# Patient Record
Sex: Male | Born: 1960 | Race: White | Hispanic: No | Marital: Married | Smoking: Current every day smoker
Health system: Southern US, Community
[De-identification: ages and names within clinical notes are randomized; demographics above are authoritative.]

## PROBLEM LIST (undated history)

## (undated) HISTORY — PX: FINGER SURGERY: SHX640

---

## 2011-11-21 ENCOUNTER — Emergency Department: Payer: Self-pay | Admitting: Internal Medicine

## 2017-05-29 ENCOUNTER — Other Ambulatory Visit: Payer: Self-pay

## 2017-05-29 ENCOUNTER — Ambulatory Visit
Admission: EM | Admit: 2017-05-29 | Discharge: 2017-05-29 | Disposition: A | Discharge status: Home / Self Care | Payer: BLUE CROSS/BLUE SHIELD | Attending: Family Medicine | Admitting: Family Medicine

## 2017-05-29 DIAGNOSIS — J40 Bronchitis, not specified as acute or chronic: Secondary | ICD-10-CM

## 2017-05-29 DIAGNOSIS — R05 Cough: Secondary | ICD-10-CM | POA: Diagnosis not present

## 2017-05-29 DIAGNOSIS — R059 Cough, unspecified: Secondary | ICD-10-CM

## 2017-05-29 MED ORDER — AZITHROMYCIN 250 MG PO TABS: ORAL_TABLET | ORAL | 0 refills | Status: DC

## 2017-05-29 MED ORDER — ALBUTEROL SULFATE HFA 108 (90 BASE) MCG/ACT IN AERS: 1.0000 | INHALATION_SPRAY | Freq: Four times a day (QID) | RESPIRATORY_TRACT | 0 refills | Status: AC | PRN

## 2017-05-29 MED ORDER — PREDNISONE 20 MG PO TABS: ORAL_TABLET | ORAL | 0 refills | Status: DC

## 2017-05-29 NOTE — ED Provider Notes (Signed)
[facility]    CSN: [id number] Arrival date & time: 05/29/17  1332     History   Chief Complaint Chief Complaint  Patient presents with  . Cough  . Nasal Congestion    HPI David Kaiser is a 56 y.o. male.   The history is provided by the patient.  Cough  Associated symptoms: wheezing   URI  Presenting symptoms: congestion and cough   Severity:  Moderate Onset quality:  Sudden Duration:  7 days Timing:  Constant Progression:  Worsening Chronicity:  New Relieved by:  Nothing Ineffective treatments:  OTC medications Associated symptoms: wheezing   Risk factors: not elderly, no chronic cardiac disease, no chronic kidney disease, no diabetes mellitus, no immunosuppression, no recent illness and no recent travel  Chronic respiratory disease: unknown, however chronic smoker.     No past medical history on file.  There are no active problems to display for this patient.        Home Medications    Prior to Admission medications   Medication Sig Start Date End Date Taking? Authorizing Provider  albuterol (PROVENTIL HFA;VENTOLIN HFA) 108 (90 Base) MCG/ACT inhaler Inhale 1-2 puffs into the lungs every 6 (six) hours as needed for wheezing or shortness of breath. 05/29/17   Payton Mccallumonty, Evy Lutterman, MD  azithromycin (ZITHROMAX Z-PAK) 250 MG tablet 2 tabs po once day 1, then 1 tab po qd for next 4 days 05/29/17   Payton Mccallumonty, Sharada Albornoz, MD  predniSONE (DELTASONE) 20 MG tablet 3 tabs po once day 1, then 2 tabs po qd for 2 days, then 1 tab po qd for 2 days, then half a tab po qd for 2 days, 05/29/17   Payton Mccallumonty, Barclay Lennox, MD    Family History No family history on file.  Social History Social History   Tobacco Use  . Smoking status: Not on file  Substance Use Topics  . Alcohol use: Not on file  . Drug use: Not on file     Allergies   Patient has no known allergies.   Review of Systems Review of Systems  HENT: Positive for congestion.   Respiratory: Positive for  cough and wheezing.      Physical Exam Triage Vital Signs ED Triage Vitals  Enc Vitals Group     BP 05/29/17 1427 135/84     Pulse Rate 05/29/17 1427 78     Resp --      Temp 05/29/17 1427 98.6 F (37 C)     Temp Source 05/29/17 1427 Oral     SpO2 05/29/17 1427 97 %     Weight 05/29/17 1429 204 lb (92.5 kg)     Height 05/29/17 1429 5\' 11"  (1.803 m)     Head Circumference --      Peak Flow --      Pain Score --      Pain Loc --      Pain Edu? --      Excl. in GC? --    No data found.  Updated Vital Signs BP 135/84 (BP Location: Left Arm)   Pulse 78   Temp 98.6 F (37 C) (Oral)   Ht 5\' 11"  (1.803 m)   Wt 204 lb (92.5 kg)   SpO2 97%   BMI 28.45 kg/m   Visual Acuity Right Eye Distance:   Left Eye Distance:   Bilateral Distance:    Right Eye Near:   Left Eye Near:    Bilateral Near:     Physical  Exam  Constitutional: He appears well-developed and well-nourished. No distress.  HENT:  Head: Normocephalic and atraumatic.  Right Ear: Tympanic membrane, external ear and ear canal normal.  Left Ear: Tympanic membrane, external ear and ear canal normal.  Nose: Nose normal.  Mouth/Throat: Uvula is midline, oropharynx is clear and moist and mucous membranes are normal. No oropharyngeal exudate or tonsillar abscesses.  Eyes: Conjunctivae and EOM are normal. Pupils are equal, round, and reactive to light. Right eye exhibits no discharge. Left eye exhibits no discharge. No scleral icterus.  Neck: Normal range of motion. Neck supple. No tracheal deviation present. No thyromegaly present.  Cardiovascular: Normal rate, regular rhythm and normal heart sounds.  Pulmonary/Chest: Effort normal. No stridor. No respiratory distress. He has wheezes. He has rales. He exhibits no tenderness.  Lymphadenopathy:    He has no cervical adenopathy.  Neurological: He is alert.  Skin: Skin is warm and dry. No rash noted. He is not diaphoretic.  Nursing note and vitals reviewed.    UC  Treatments / Results  Labs (all labs ordered are listed, but only abnormal results are displayed) Labs Reviewed - No data to display  EKG  EKG Interpretation None       Radiology No results found.  Procedures Procedures (including critical care time)  Medications Ordered in UC Medications - No data to display   Initial Impression / Assessment and Plan / UC Course  I have reviewed the triage vital signs and the nursing notes.  Pertinent labs & imaging results that were available during my care of the patient were reviewed by me and considered in my medical decision making (see chart for details).      Final Clinical Impressions(s) / UC Diagnoses   Final diagnoses:  Cough  Bronchitis    ED Discharge Orders        Ordered    azithromycin (ZITHROMAX Z-PAK) 250 MG tablet     05/29/17 1448    predniSONE (DELTASONE) 20 MG tablet     05/29/17 1448    albuterol (PROVENTIL HFA;VENTOLIN HFA) 108 (90 Base) MCG/ACT inhaler  Every 6 hours PRN     05/29/17 1448     1. diagnosis reviewed with patient 2. rx as per orders above; reviewed possible side effects, interactions, risks and benefits  3. Recommend supportive treatment with rest, fluids 4. Follow-up prn if symptoms worsen or don't improve   Controlled Substance Prescriptions [place] Controlled Substance Registry consulted? Not Applicable   Payton Mccallum, MD 05/29/17 [phone]

## 2017-05-29 NOTE — ED Triage Notes (Signed)
5 day hx of cough, congestion and diarrhea.  Productive cough was green but now white.  No known fever. Some relief with imodium and mucinex.

## 2019-01-01 ENCOUNTER — Emergency Department
Admission: EM | Admit: 2019-01-01 | Discharge: 2019-01-01 | Disposition: A | Discharge status: Home / Self Care | Payer: Self-pay | Attending: Emergency Medicine | Admitting: Emergency Medicine

## 2019-01-01 ENCOUNTER — Encounter: Payer: Self-pay | Admitting: Emergency Medicine

## 2019-01-01 ENCOUNTER — Emergency Department: Payer: Self-pay

## 2019-01-01 ENCOUNTER — Other Ambulatory Visit: Payer: Self-pay

## 2019-01-01 DIAGNOSIS — R0789 Other chest pain: Secondary | ICD-10-CM | POA: Insufficient documentation

## 2019-01-01 DIAGNOSIS — R609 Edema, unspecified: Secondary | ICD-10-CM | POA: Insufficient documentation

## 2019-01-01 DIAGNOSIS — R0602 Shortness of breath: Secondary | ICD-10-CM | POA: Insufficient documentation

## 2019-01-01 DIAGNOSIS — F17200 Nicotine dependence, unspecified, uncomplicated: Secondary | ICD-10-CM | POA: Insufficient documentation

## 2019-01-01 DIAGNOSIS — J209 Acute bronchitis, unspecified: Secondary | ICD-10-CM | POA: Insufficient documentation

## 2019-01-01 DIAGNOSIS — J40 Bronchitis, not specified as acute or chronic: Secondary | ICD-10-CM

## 2019-01-01 LAB — COMPREHENSIVE METABOLIC PANEL
ALT: 31 U/L (ref 0–44)
AST: 24 U/L (ref 15–41)
Albumin: 4.1 g/dL (ref 3.5–5.0)
Alkaline Phosphatase: 60 U/L (ref 38–126)
Anion gap: 9 (ref 5–15)
BUN: 18 mg/dL (ref 6–20)
CO2: 24 mmol/L (ref 22–32)
Calcium: 8.6 mg/dL — ABNORMAL LOW (ref 8.9–10.3)
Chloride: 104 mmol/L (ref 98–111)
Creatinine, Ser: 0.84 mg/dL (ref 0.61–1.24)
GFR calc Af Amer: >60 mL/min (ref >60)
GFR calc non Af Amer: >60 mL/min (ref >60)
Glucose, Bld: 128 mg/dL — ABNORMAL HIGH (ref 70–99)
Potassium: 4.1 mmol/L (ref 3.5–5.1)
Sodium: 137 mmol/L (ref 135–145)
Total Bilirubin: 0.6 mg/dL (ref 0.3–1.2)
Total Protein: 6.8 g/dL (ref 6.5–8.1)

## 2019-01-01 LAB — CBC WITH DIFFERENTIAL/PLATELET
Abs Immature Granulocytes: 0.04 10*3/uL (ref 0.00–0.07)
Basophils Absolute: 0 10*3/uL (ref 0.0–0.1)
Basophils Relative: 1 %
Eosinophils Absolute: 0.2 10*3/uL (ref 0.0–0.5)
Eosinophils Relative: 3 %
HCT: 44.3 % (ref 39.0–52.0)
Hemoglobin: 15 g/dL (ref 13.0–17.0)
Immature Granulocytes: 1 %
Lymphocytes Relative: 21 %
Lymphs Abs: 1.3 10*3/uL (ref 0.7–4.0)
MCH: 34.6 pg — ABNORMAL HIGH (ref 26.0–34.0)
MCHC: 33.9 g/dL (ref 30.0–36.0)
MCV: 102.3 fL — ABNORMAL HIGH (ref 80.0–100.0)
Monocytes Absolute: 0.7 10*3/uL (ref 0.1–1.0)
Monocytes Relative: 11 %
Neutro Abs: 4.2 10*3/uL (ref 1.7–7.7)
Neutrophils Relative %: 63 %
Platelets: 175 10*3/uL (ref 150–400)
RBC: 4.33 MIL/uL (ref 4.22–5.81)
RDW: 13.2 % (ref 11.5–15.5)
WBC: 6.5 10*3/uL (ref 4.0–10.5)
nRBC: 0 % (ref 0.0–0.2)

## 2019-01-01 LAB — BRAIN NATRIURETIC PEPTIDE: B Natriuretic Peptide: 30 pg/mL (ref 0.0–100.0)

## 2019-01-01 LAB — TROPONIN I (HIGH SENSITIVITY): Troponin I (High Sensitivity): <2 ng/L (ref <18)

## 2019-01-01 LAB — FIBRIN DERIVATIVES D-DIMER (ARMC ONLY): Fibrin derivatives D-dimer (ARMC): 404.31 ng/mL (FEU) (ref 0.00–499.00)

## 2019-01-01 MED ORDER — IPRATROPIUM-ALBUTEROL 0.5-2.5 (3) MG/3ML IN SOLN
3.0000 mL | Freq: Once | RESPIRATORY_TRACT | Status: AC
  Administered 2019-01-01: 3 mL via RESPIRATORY_TRACT
  Filled 2019-01-01: qty 3

## 2019-01-01 MED ORDER — PREDNISONE 20 MG PO TABS: 40.0000 mg | ORAL_TABLET | Freq: Every day | ORAL | 0 refills | Status: AC

## 2019-01-01 MED ORDER — METHYLPREDNISOLONE SODIUM SUCC 125 MG IJ SOLR
125.0000 mg | Freq: Once | INTRAMUSCULAR | Status: AC
  Administered 2019-01-01: 125 mg via INTRAVENOUS
  Filled 2019-01-01: qty 2

## 2019-01-01 MED ORDER — DOXYCYCLINE HYCLATE 50 MG PO CAPS: 100.0000 mg | ORAL_CAPSULE | Freq: Two times a day (BID) | ORAL | 0 refills | Status: AC

## 2019-01-01 MED ORDER — ALBUTEROL SULFATE HFA 108 (90 BASE) MCG/ACT IN AERS
2.0000 | INHALATION_SPRAY | Freq: Once | RESPIRATORY_TRACT | Status: AC
  Administered 2019-01-01: 2 via RESPIRATORY_TRACT
  Filled 2019-01-01: qty 6.7

## 2019-01-01 MED ORDER — FUROSEMIDE 10 MG/ML IJ SOLN
40.0000 mg | Freq: Once | INTRAMUSCULAR | Status: AC
  Administered 2019-01-01: 40 mg via INTRAVENOUS
  Filled 2019-01-01: qty 4

## 2019-01-01 MED ORDER — KETOROLAC TROMETHAMINE 30 MG/ML IJ SOLN
15.0000 mg | Freq: Once | INTRAMUSCULAR | Status: AC
  Administered 2019-01-01: 15 mg via INTRAVENOUS
  Filled 2019-01-01: qty 1

## 2019-01-01 MED ORDER — HYDROCODONE-ACETAMINOPHEN 5-325 MG PO TABS: 1.0000 | ORAL_TABLET | Freq: Four times a day (QID) | ORAL | 0 refills | Status: DC | PRN

## 2019-01-01 NOTE — ED Triage Notes (Signed)
Pt presents to ED via POV with c/o cough productive cough with small amounts of clear sputum, SOB, and lower back pain that is worse with with coughing. Pt able to speak in clear and complete sentences at this time. NAD noted. Pt states BLE edema x 3 days as well.

## 2019-01-01 NOTE — Discharge Instructions (Signed)
Use your inhaler, 2 puffs every 4 hours as needed for wheezing and shortness of breath.  Take the steroids and pain medication as needed.

## 2019-01-01 NOTE — ED Provider Notes (Signed)
[facility] Emergency Department Provider Note  ____________________________________________   First MD Initiated Contact with Patient 01/01/19 [phone]     (approximate)  I have reviewed the triage vital signs and the nursing notes.   HISTORY  Chief Complaint Cough, Shortness of Breath, and Back Pain    HPI David Kaiser is a 58 y.o. male here with multiple complaints.  Patient's primary complaint is right-sided back/flank pain.  Patient states that for the past several days, he has had progressively worsening cough.  He states he has a history of recurrent bronchitis during this time of the year, but states that he usually does not get pain with this.  He states that due to his frequent coughing, he is developed a sharp, stabbing, right-sided back pain that shoots around his right side whenever he coughs or takes a deep breath in.  He denies any fevers.  No sputum production.  He does note that he is also had increasing bilateral lower extremity swelling, worse on the right than left, which is new for him.  Denies recent medication change.  Denies any recent fevers or chills.  No recent immobilization or travel.  No history of DVT or PE.  His pain, as mentioned, is worse with deep inspiration and movement.  No alleviating factors.  Denies any preceding injuries.        History reviewed. No pertinent past medical history.  There are no active problems to display for this patient.   Past Surgical History:  Procedure Laterality Date  . FINGER SURGERY      Prior to Admission medications   Medication Sig Start Date End Date Taking? Authorizing Provider  albuterol (PROVENTIL HFA;VENTOLIN HFA) 108 (90 Base) MCG/ACT inhaler Inhale 1-2 puffs into the lungs every 6 (six) hours as needed for wheezing or shortness of breath. 05/29/17   Payton Mccallumonty, [place], MD  azithromycin (ZITHROMAX Z-PAK) 250 MG tablet 2 tabs po once day 1, then 1 tab po qd for next 4 days 05/29/17    Payton Mccallumonty, [place], MD  doxycycline (VIBRAMYCIN) 50 MG capsule Take 2 capsules (100 mg total) by mouth 2 (two) times daily for 7 days. 01/01/19 01/08/19  Shaune PollackIsaacs, Kortni Hasten, MD  HYDROcodone-acetaminophen (NORCO/VICODIN) 5-325 MG tablet Take 1 tablet by mouth every 6 (six) hours as needed for moderate pain (or to help with cough). 01/01/19 01/01/20  Shaune PollackIsaacs, Tripton Ned, MD  predniSONE (DELTASONE) 20 MG tablet Take 2 tablets (40 mg total) by mouth daily for 5 days. 01/01/19 01/06/19  Shaune PollackIsaacs, Allice Garro, MD    Allergies Patient has no known allergies.  No family history on file.  Social History Social History   Tobacco Use  . Smoking status: Current Every Day Smoker  . Smokeless tobacco: Never Used  Substance Use Topics  . Alcohol use: Yes  . Drug use: Never    Review of Systems  Review of Systems  Constitutional: Negative for chills and fever.  HENT: Negative for sore throat.   Respiratory: Positive for cough, chest tightness and shortness of breath.   Cardiovascular: Positive for leg swelling. Negative for chest pain.  Gastrointestinal: Negative for abdominal pain.  Genitourinary: Negative for flank pain.  Musculoskeletal: Negative for neck pain.  Skin: Positive for rash (Small, erythematous, rash to the left thigh). Negative for wound.  Allergic/Immunologic: Negative for immunocompromised state.  Neurological: Negative for weakness and numbness.  Hematological: Does not bruise/bleed easily.     ____________________________________________  PHYSICAL EXAM:      VITAL SIGNS: ED Triage  Vitals [01/01/19 0807]  Enc Vitals Group     BP (!) 133/99     Pulse Rate 79     Resp 18     Temp 97.9 F (36.6 C)     Temp Source Oral     SpO2 98 %     Weight 230 lb (104.3 kg)     Height 5\' 11"  (1.803 m)     Head Circumference      Peak Flow      Pain Score 10     Pain Loc      Pain Edu?      Excl. in GC?      Physical Exam Vitals signs and nursing note reviewed.  Constitutional:      General: He  is not in acute distress.    Appearance: He is well-developed.  HENT:     Head: Normocephalic and atraumatic.  Eyes:     Conjunctiva/sclera: Conjunctivae normal.  Neck:     Musculoskeletal: Neck supple.  Cardiovascular:     Rate and Rhythm: Normal rate and regular rhythm.     Heart sounds: Normal heart sounds. No murmur. No friction rub.  Pulmonary:     Effort: Pulmonary effort is normal. No respiratory distress.     Breath sounds: Wheezing (Scant, expiratory) present. No rales.     Comments: Normal work of breathing, speaking in full sentences Abdominal:     General: There is no distension.     Palpations: Abdomen is soft.     Tenderness: There is no abdominal tenderness.  Musculoskeletal:     Right lower leg: Edema (2+, pitting) present.     Left lower leg: Edema (2+, pitting) present.  Skin:    General: Skin is warm.     Capillary Refill: Capillary refill takes less than 2 seconds.     Comments: Well demarcated, mildly erythematous, approximately 3 x 2 cm rash surrounding hair follicle of the left anterolateral thigh.  No fluctuance.  No drainage.  No open wounds.  Neurological:     Mental Status: He is alert and oriented to person, place, and time.     Motor: No abnormal muscle tone.       ____________________________________________   LABS (all labs ordered are listed, but only abnormal results are displayed)  Labs Reviewed  CBC WITH DIFFERENTIAL/PLATELET - Abnormal; Notable for the following components:      Result Value   MCV 102.3 (*)    MCH 34.6 (*)    All other components within normal limits  COMPREHENSIVE METABOLIC PANEL - Abnormal; Notable for the following components:   Glucose, Bld 128 (*)    Calcium 8.6 (*)    All other components within normal limits  BRAIN NATRIURETIC PEPTIDE  TROPONIN I (HIGH SENSITIVITY)  FIBRIN DERIVATIVES D-DIMER ([facility] ONLY)    ____________________________________________  EKG: Normal sinus rhythm, ventricular rate 70.   Slight early R wave progression, but otherwise no acute ischemic changes.  PR 150, QRS 87, QTc 429. ________________________________________  RADIOLOGY All imaging, including plain films, CT scans, and ultrasounds, independently reviewed by me, and interpretations confirmed via formal radiology reads.  ED MD interpretation:   Chest x-ray: Negative  Official radiology report(s): Dg Chest 2 View  Result Date: 01/01/2019 CLINICAL DATA:  Cough and chest pain EXAM: CHEST - 2 VIEW COMPARISON:  None. FINDINGS: Lungs are clear. Heart size and pulmonary vascularity are normal. No adenopathy. No pneumothorax. There is mild degenerative change in the thoracic spine. IMPRESSION: No  edema or consolidation.  No adenopathy. Electronically Signed   By: Bretta BangWilliam  Woodruff III M.D.   On: 01/01/2019 08:47    ____________________________________________  PROCEDURES   Procedure(s) performed (including Critical Care):  Procedures  ____________________________________________  INITIAL IMPRESSION / MDM / ASSESSMENT AND PLAN / ED COURSE  As part of my medical decision making, I reviewed the following data within the electronic MEDICAL RECORD NUMBER Notes from prior ED visits and [place] Controlled Substance Database      *David Quillian QuinceJ Corkern was evaluated in Emergency Department on 01/01/2019 for the symptoms described in the history of present illness. He was evaluated in the context of the global COVID-19 pandemic, which necessitated consideration that the patient might be at risk for infection with the SARS-CoV-2 virus that causes COVID-19. Institutional protocols and algorithms that pertain to the evaluation of patients at risk for COVID-19 are in a state of rapid change based on information released by regulatory bodies including the CDC and federal and state organizations. These policies and algorithms were followed during the patient's care in the ED.  Some ED evaluations and interventions may be delayed as a result  of limited staffing during the pandemic.*   Clinical Course as of Jan 01 1103  Wed Jan 01, 2019  [id number] 58 year old male here with cough, shortness of breath, and right lateral chest wall pain.  Suspect recurrent bronchitis with likely musculoskeletal chest wall pain, but differential is broad and includes pneumonia, PE, both clinically and supraglottal ACS.  Will check chest x-ray, labs.  No fever or infectious symptoms, doubt coronavirus.   [CI]    Clinical Course User Index [CI] Shaune PollackIsaacs, Chales Pelissier, MD    Medical Decision Making: Lab work reviewed and is overall very reassuring.  Troponin undetectable despite constant symptoms with high sensitivity assay, making ACS unlikely.  Renal function, LFTs, BNP all within normal limits.  D-dimer negative, making PE or other clot, dissection, unlikely.  I suspect he has recurrent bronchitis with musculoskeletal chest wall pain.  Will give him empiric doxycycline as he also has that small area of redness on his left thigh, start on steroids and inhaler, and discharge home.  For his edema, suspect this is dependent in setting of decreased movement from his bronchitis.  He was given a dose of Lasix here.  Will hold on starting this empirically, to prevent dehydration.  He will follow-up with a PCP. ____________________________________________  FINAL CLINICAL IMPRESSION(S) / ED DIAGNOSES  Final diagnoses:  Dependent edema  Bronchitis  Chest wall pain     MEDICATIONS GIVEN DURING THIS VISIT:  Medications  albuterol (VENTOLIN HFA) 108 (90 Base) MCG/ACT inhaler 2 puff (has no administration in time range)  ipratropium-albuterol (DUONEB) 0.5-2.5 (3) MG/3ML nebulizer solution 3 mL (3 mLs Nebulization Given 01/01/19 0947)  ketorolac (TORADOL) 30 MG/ML injection 15 mg (15 mg Intravenous Given 01/01/19 1029)  furosemide (LASIX) injection 40 mg (40 mg Intravenous Given 01/01/19 1030)  methylPREDNISolone sodium succinate (SOLU-MEDROL) 125 mg/2 mL injection 125 mg  (125 mg Intravenous Given 01/01/19 1032)     ED Discharge Orders         Ordered    predniSONE (DELTASONE) 20 MG tablet  Daily     01/01/19 1102    doxycycline (VIBRAMYCIN) 50 MG capsule  2 times daily     01/01/19 1102    HYDROcodone-acetaminophen (NORCO/VICODIN) 5-325 MG tablet  Every 6 hours PRN     01/01/19 1104           Note:  This document was prepared using Dragon voice recognition software and may include unintentional dictation errors.   Duffy Bruce, MD 01/01/19 1104

## 2019-01-09 ENCOUNTER — Other Ambulatory Visit: Payer: Self-pay

## 2019-01-09 ENCOUNTER — Emergency Department: Payer: Self-pay

## 2019-01-09 ENCOUNTER — Emergency Department
Admission: EM | Admit: 2019-01-09 | Discharge: 2019-01-09 | Disposition: A | Discharge status: Home / Self Care | Payer: Self-pay | Attending: Emergency Medicine | Admitting: Emergency Medicine

## 2019-01-09 DIAGNOSIS — W172XXD Fall into hole, subsequent encounter: Secondary | ICD-10-CM | POA: Insufficient documentation

## 2019-01-09 DIAGNOSIS — S301XXD Contusion of abdominal wall, subsequent encounter: Secondary | ICD-10-CM | POA: Insufficient documentation

## 2019-01-09 DIAGNOSIS — F17210 Nicotine dependence, cigarettes, uncomplicated: Secondary | ICD-10-CM | POA: Insufficient documentation

## 2019-01-09 LAB — CBC WITH DIFFERENTIAL/PLATELET
Abs Immature Granulocytes: 0.07 10*3/uL (ref 0.00–0.07)
Basophils Absolute: 0 10*3/uL (ref 0.0–0.1)
Basophils Relative: 0 %
Eosinophils Absolute: 0.2 10*3/uL (ref 0.0–0.5)
Eosinophils Relative: 2 %
HCT: 44.4 % (ref 39.0–52.0)
Hemoglobin: 15.2 g/dL (ref 13.0–17.0)
Immature Granulocytes: 1 %
Lymphocytes Relative: 15 %
Lymphs Abs: 1.4 10*3/uL (ref 0.7–4.0)
MCH: 34.9 pg — ABNORMAL HIGH (ref 26.0–34.0)
MCHC: 34.2 g/dL (ref 30.0–36.0)
MCV: 102.1 fL — ABNORMAL HIGH (ref 80.0–100.0)
Monocytes Absolute: 0.5 10*3/uL (ref 0.1–1.0)
Monocytes Relative: 6 %
Neutro Abs: 7.4 10*3/uL (ref 1.7–7.7)
Neutrophils Relative %: 76 %
Platelets: 182 10*3/uL (ref 150–400)
RBC: 4.35 MIL/uL (ref 4.22–5.81)
RDW: 13 % (ref 11.5–15.5)
WBC: 9.6 10*3/uL (ref 4.0–10.5)
nRBC: 0 % (ref 0.0–0.2)

## 2019-01-09 LAB — COMPREHENSIVE METABOLIC PANEL
ALT: 42 U/L (ref 0–44)
AST: 21 U/L (ref 15–41)
Albumin: 3.9 g/dL (ref 3.5–5.0)
Alkaline Phosphatase: 65 U/L (ref 38–126)
Anion gap: 8 (ref 5–15)
BUN: 20 mg/dL (ref 6–20)
CO2: 24 mmol/L (ref 22–32)
Calcium: 8.2 mg/dL — ABNORMAL LOW (ref 8.9–10.3)
Chloride: 105 mmol/L (ref 98–111)
Creatinine, Ser: 0.91 mg/dL (ref 0.61–1.24)
GFR calc Af Amer: >60 mL/min (ref >60)
GFR calc non Af Amer: >60 mL/min (ref >60)
Glucose, Bld: 183 mg/dL — ABNORMAL HIGH (ref 70–99)
Potassium: 3.7 mmol/L (ref 3.5–5.1)
Sodium: 137 mmol/L (ref 135–145)
Total Bilirubin: 0.5 mg/dL (ref 0.3–1.2)
Total Protein: 6.2 g/dL — ABNORMAL LOW (ref 6.5–8.1)

## 2019-01-09 MED ORDER — TRAMADOL HCL 50 MG PO TABS: 50.0000 mg | ORAL_TABLET | Freq: Four times a day (QID) | ORAL | 0 refills | Status: DC | PRN

## 2019-01-09 MED ORDER — IOHEXOL 300 MG/ML  SOLN
100.0000 mL | Freq: Once | INTRAMUSCULAR | Status: AC | PRN
  Administered 2019-01-09: 100 mL via INTRAVENOUS

## 2019-01-09 MED ORDER — TRAMADOL HCL 50 MG PO TABS
50.0000 mg | ORAL_TABLET | Freq: Once | ORAL | Status: AC
Start: 2019-01-09 — End: 2019-01-09
  Administered 2019-01-09: 16:00:00 50 mg via ORAL
  Filled 2019-01-09: qty 1

## 2019-01-09 NOTE — ED Triage Notes (Signed)
Pt fell in a hole 1-2 weeks ago - started having swelling in bilat hips and legs - he was seen in ED and given Lasix (which helped) and albuterol (which he is out of) Pt now c/o swelling to bilat hips and rib cage area - noted to have bruising to right rib cage - pt c/o [facility]

## 2019-01-09 NOTE — ED Triage Notes (Signed)
Says he is here for same thing as last time.  Swelling of lower extremities up to chest area.  Says it is worse on the right now.  Has not been able to get pcp yet.

## 2019-01-09 NOTE — Discharge Instructions (Addendum)
Return to the ER for new, worsening, or persistent pain, swelling, difficulty breathing, weakness or lightheadedness, or any other new or worsening symptoms that concern you.

## 2019-01-09 NOTE — ED Provider Notes (Signed)
[facility] Emergency Department Provider Note ____________________________________________   First MD Initiated Contact with Patient 01/09/19 1551     (approximate)  I have reviewed the triage vital signs and the nursing notes.   HISTORY  Chief Complaint Shortness of Breath and Leg Swelling    HPI David Kaiser is a 58 y.o. male with PMH as noted below who presents primarily for persistent symptoms after a fall about 10 days ago, mainly right sided abdominal pain, bruising, and swelling.  The patient states that he fell into a hole in his garden.  He states he initially had swelling and pain to the right-sided ribs, right abdomen, as well as bilateral hips and legs with some worse swelling on the right.  He was evaluated in the ED at that time.  The patient states that the main symptom that is persistent and somewhat worse is bruising and pain along the right side of his abdomen.  He states that the swelling to the legs and hips has markedly improved on both sides.  He reports pain with taking a deep breath but denies any significant shortness of breath to me.   History reviewed. No pertinent past medical history.  There are no active problems to display for this patient.   Past Surgical History:  Procedure Laterality Date  . FINGER SURGERY      Prior to Admission medications   Medication Sig Start Date End Date Taking? Authorizing Provider  albuterol (PROVENTIL HFA;VENTOLIN HFA) 108 (90 Base) MCG/ACT inhaler Inhale 1-2 puffs into the lungs every 6 (six) hours as needed for wheezing or shortness of breath. 05/29/17   Norval Gable, MD  azithromycin (ZITHROMAX Z-PAK) 250 MG tablet 2 tabs po once day 1, then 1 tab po qd for next 4 days 05/29/17   Norval Gable, MD  HYDROcodone-acetaminophen (NORCO/VICODIN) 5-325 MG tablet Take 1 tablet by mouth every 6 (six) hours as needed for moderate pain (or to help with cough). 01/01/19 01/01/20  Duffy Bruce, MD   traMADol (ULTRAM) 50 MG tablet Take 1 tablet (50 mg total) by mouth every 6 (six) hours as needed. 01/09/19 01/09/20  Arta Silence, MD    Allergies Bee venom  No family history on file.  Social History Social History   Tobacco Use  . Smoking status: Current Every Day Smoker    Packs/day: 0.50    Types: Cigarettes  . Smokeless tobacco: Never Used  Substance Use Topics  . Alcohol use: Yes    Alcohol/week: 3.0 - 12.0 standard drinks    Types: 3 - 12 Cans of beer per week    Comment: daily  . Drug use: Never    Review of Systems  Constitutional: No fever. Eyes: No redness. ENT: No sore throat. Cardiovascular: Denies chest pain. Respiratory: Denies shortness of breath. Gastrointestinal: No vomiting or diarrhea.  Genitourinary: Negative for dysuria.  Musculoskeletal: Negative for back pain. Skin: Negative for rash. Neurological: Negative for headache.   ____________________________________________   PHYSICAL EXAM:  VITAL SIGNS: ED Triage Vitals  Enc Vitals Group     BP 01/09/19 1256 132/87     Pulse Rate 01/09/19 1256 96     Resp 01/09/19 1256 18     Temp 01/09/19 1256 98.8 F (37.1 C)     Temp Source 01/09/19 1256 Oral     SpO2 01/09/19 1256 97 %     Weight 01/09/19 1310 233 lb (105.7 kg)     Height 01/09/19 1310 5\' 11"  (1.803 m)  Head Circumference --      Peak Flow --      Pain Score 01/09/19 1310 8     Pain Loc --      Pain Edu? --      Excl. in [place]? --     Constitutional: Alert and oriented. Well appearing and in no acute distress. Eyes: Conjunctivae are normal.  Head: Atraumatic. Nose: No congestion/rhinnorhea. Mouth/Throat: Mucous membranes are moist.   Neck: Normal range of motion.  Cardiovascular: Normal rate, regular rhythm. Grossly normal heart sounds.  Good peripheral circulation. Respiratory: Normal respiratory effort.  No retractions. Lungs CTAB. Gastrointestinal: Soft with ecchymosis along the right side of the abdomen and down to  the suprapubic area with some induration and tenderness.  No distention.  Genitourinary: No flank tenderness. Musculoskeletal: Trace bilateral lower extremity edema.  No calf or popliteal swelling or tenderness.  Extremities warm and well perfused.  Neurologic:  Normal speech and language. No gross focal neurologic deficits are appreciated.  Skin:  Skin is warm and dry. No rash noted. Psychiatric: Mood and affect are normal. Speech and behavior are normal.  ____________________________________________   LABS (all labs ordered are listed, but only abnormal results are displayed)  Labs Reviewed  CBC WITH DIFFERENTIAL/PLATELET - Abnormal; Notable for the following components:      Result Value   MCV 102.1 (*)    MCH 34.9 (*)    All other components within normal limits  COMPREHENSIVE METABOLIC PANEL - Abnormal; Notable for the following components:   Glucose, Bld 183 (*)    Calcium 8.2 (*)    Total Protein 6.2 (*)    All other components within normal limits   ____________________________________________  EKG   ____________________________________________  RADIOLOGY  CXR: No acute abnormality CT abdomen: Subcutaneous edema on the right  ____________________________________________   PROCEDURES  Procedure(s) performed: No  Procedures  Critical Care performed: No ____________________________________________   INITIAL IMPRESSION / ASSESSMENT AND PLAN / ED COURSE  Pertinent labs & imaging results that were available during my care of the patient were reviewed by me and considered in my medical decision making (see chart for details).  58 year old male with PMH as noted above presents with persistent right-sided abdominal pain and bruising after a fall into a hole in his garden about 10 days ago.  I reviewed the past medical records in epic.  The patient was seen on 01/01/2019 with a complaint of right-sided back and flank pain, shortness of breath and cough, and leg  swelling.  He had a negative chest x-ray and negative d-dimer at that time.  I do not see any mention of a trauma at that time, but he states that the fall did happen before he came to the hospital.  On exam today the patient is well-appearing and his vital signs are normal.  He does have relatively significant ecchymosis and some tenderness along the right abdominal wall.  Cardiopulmonary exam is normal.  With the patient told me is a bit different from what was noted in the triage notes.  The patient states that the swelling he experienced in bilateral lower extremities and hips has almost completely resolved and he denies any new or worsening shortness of breath.  At this time there is no clinical evidence of DVT, especially given the negative d-dimer last time and the resolving swelling.  Chest x-ray and lab work-up showed no acute abnormalities.  I mainly concerned for right abdominal wall hematoma but given the persistent pain and swelling  after 10 days, I will obtain CT to further evaluate and rule out any internal injury.  ----------------------------------------- 6:01 PM on 01/09/2019 -----------------------------------------  CT shows subcutaneous edema on the right consistent with the area of hematoma on exam.  Patient had good relief with tramadol.  At this time, he is stable for discharge home.  I counseled him on the results of the work-up and on return precautions.  He expressed understanding. ____________________________________________   FINAL CLINICAL IMPRESSION(S) / ED DIAGNOSES  Final diagnoses:  Abdominal wall hematoma, subsequent encounter      NEW MEDICATIONS STARTED DURING THIS VISIT:  Discharge Medication List as of 01/09/2019  5:36 PM    START taking these medications   Details  traMADol (ULTRAM) 50 MG tablet Take 1 tablet (50 mg total) by mouth every 6 (six) hours as needed., Starting Thu 01/09/2019, Until Fri 01/09/2020, Normal         Note:  This document  was prepared using Dragon voice recognition software and may include unintentional dictation errors.    Dionne Bucy, MD 01/09/19 [phone]

## 2019-07-19 ENCOUNTER — Emergency Department: Payer: Self-pay

## 2019-07-19 ENCOUNTER — Encounter: Payer: Self-pay | Admitting: Emergency Medicine

## 2019-07-19 ENCOUNTER — Other Ambulatory Visit: Payer: Self-pay

## 2019-07-19 ENCOUNTER — Emergency Department
Admission: EM | Admit: 2019-07-19 | Discharge: 2019-07-19 | Disposition: A | Discharge status: Home / Self Care | Payer: Self-pay | Attending: Emergency Medicine | Admitting: Emergency Medicine

## 2019-07-19 DIAGNOSIS — R079 Chest pain, unspecified: Secondary | ICD-10-CM

## 2019-07-19 DIAGNOSIS — R0789 Other chest pain: Secondary | ICD-10-CM | POA: Insufficient documentation

## 2019-07-19 DIAGNOSIS — F17210 Nicotine dependence, cigarettes, uncomplicated: Secondary | ICD-10-CM | POA: Insufficient documentation

## 2019-07-19 LAB — CBC
HCT: 47.3 % (ref 39.0–52.0)
Hemoglobin: 16.1 g/dL (ref 13.0–17.0)
MCH: 33.9 pg (ref 26.0–34.0)
MCHC: 34 g/dL (ref 30.0–36.0)
MCV: 99.6 fL (ref 80.0–100.0)
Platelets: 176 10*3/uL (ref 150–400)
RBC: 4.75 MIL/uL (ref 4.22–5.81)
RDW: 13 % (ref 11.5–15.5)
WBC: 7.4 10*3/uL (ref 4.0–10.5)
nRBC: 0 % (ref 0.0–0.2)

## 2019-07-19 LAB — BASIC METABOLIC PANEL
Anion gap: 14 (ref 5–15)
BUN: 19 mg/dL (ref 6–20)
CO2: 23 mmol/L (ref 22–32)
Calcium: 9.4 mg/dL (ref 8.9–10.3)
Chloride: 98 mmol/L (ref 98–111)
Creatinine, Ser: 0.92 mg/dL (ref 0.61–1.24)
GFR calc Af Amer: >60 mL/min (ref >60)
GFR calc non Af Amer: >60 mL/min (ref >60)
Glucose, Bld: 133 mg/dL — ABNORMAL HIGH (ref 70–99)
Potassium: 4.2 mmol/L (ref 3.5–5.1)
Sodium: 135 mmol/L (ref 135–145)

## 2019-07-19 LAB — TROPONIN I (HIGH SENSITIVITY)
Troponin I (High Sensitivity): 4 ng/L (ref <18)
Troponin I (High Sensitivity): 3 ng/L (ref <18)

## 2019-07-19 MED ORDER — SODIUM CHLORIDE 0.9% FLUSH: 3.0000 mL | Freq: Once | INTRAVENOUS | Status: DC

## 2019-07-19 MED ORDER — PREDNISONE 20 MG PO TABS: 20.0000 mg | ORAL_TABLET | Freq: Every day | ORAL | 0 refills | Status: AC

## 2019-07-19 MED ORDER — LIDOCAINE 5 % EX PTCH: 1.0000 | MEDICATED_PATCH | CUTANEOUS | 0 refills | Status: AC

## 2019-07-19 MED ORDER — OXYCODONE-ACETAMINOPHEN 5-325 MG PO TABS
1.0000 | ORAL_TABLET | Freq: Once | ORAL | Status: AC
  Administered 2019-07-19: 1 via ORAL
  Filled 2019-07-19: qty 1

## 2019-07-19 MED ORDER — CYCLOBENZAPRINE HCL 5 MG PO TABS: 5.0000 mg | ORAL_TABLET | Freq: Three times a day (TID) | ORAL | 0 refills | Status: AC | PRN

## 2019-07-19 NOTE — ED Notes (Signed)
Pt c/o CP since 1am this morning. Pt denies any n/v, blurry vision, or headache. Pt denies and cardiac issues.

## 2019-07-19 NOTE — ED Triage Notes (Signed)
Pt to ED via POV c/o chest pain that started around 0100. Pt states that the pain is located in the right rib area. Pt states that the pain radiates towards the center of his chest. Pt states that the pain has increased in intensity. Pt denies ShOB, N/V, or dizziness. Pt is in NAD.

## 2019-07-19 NOTE — ED Provider Notes (Signed)
[facility] Emergency Department Provider Note ____________________________________________  Time seen: 1117  I have reviewed the triage vital signs and the nursing notes.  HISTORY  Chief Complaint  Chest Pain  HPI David Kaiser is a 59 y.o. male presents himself to the ED for evaluation of sudden onset of chest pain at about 1:00 this morning, while asleep.  Patient describes the pain located primarily in the right rib section, and refers/radiates towards the center of his chest and posteriorly.  The pain has increased in intensity since onset.  The patient notes the chest wall is tender to palpation over the area of concern. Patient denies any shortness of breath, nausea, vomiting, diaphoresis, dizziness, or syncope.  He also denies any weakness, cough, or congestion.   Patient is a current everyday smoker, but denies any preceding history of chronic or intermittent chest pain.  History reviewed. No pertinent past medical history.  There are no problems to display for this patient.   Past Surgical History:  Procedure Laterality Date  . FINGER SURGERY      Prior to Admission medications   Medication Sig Start Date End Date Taking? Authorizing Provider  albuterol (PROVENTIL HFA;VENTOLIN HFA) 108 (90 Base) MCG/ACT inhaler Inhale 1-2 puffs into the lungs every 6 (six) hours as needed for wheezing or shortness of breath. 05/29/17   Payton Mccallum, MD  cyclobenzaprine (FLEXERIL) 5 MG tablet Take 1 tablet (5 mg total) by mouth 3 (three) times daily as needed. 07/19/19   Kamry Faraci, Charlesetta Ivory, PA-C  lidocaine (LIDODERM) 5 % Place 1 patch onto the skin daily for 5 days. Remove & Discard patch after 12 hours of wear each day. 07/19/19 07/24/19  Avonna Iribe, Charlesetta Ivory, PA-C  predniSONE (DELTASONE) 20 MG tablet Take 1 tablet (20 mg total) by mouth daily with breakfast for 5 days. 07/19/19 07/24/19  Ryin Ambrosius, Charlesetta Ivory, PA-C    Allergies Bee venom  History  reviewed. No pertinent family history.  Social History Social History   Tobacco Use  . Smoking status: Current Every Day Smoker    Packs/day: 0.50    Types: Cigarettes  . Smokeless tobacco: Never Used  Substance Use Topics  . Alcohol use: Yes    Alcohol/week: 3.0 - 12.0 standard drinks    Types: 3 - 12 Cans of beer per week    Comment: daily  . Drug use: Never    Review of Systems  Constitutional: Negative for fever. Eyes: Negative for visual changes. ENT: Negative for sore throat. Cardiovascular: Positive for chest pain. Respiratory: Negative for shortness of breath. Gastrointestinal: Negative for abdominal pain, vomiting and diarrhea. Genitourinary: Negative for dysuria. Musculoskeletal: Negative for back pain. Skin: Negative for rash. Neurological: Negative for headaches, focal weakness or numbness. ____________________________________________  PHYSICAL EXAM:  VITAL SIGNS: ED Triage Vitals  Enc Vitals Group     BP 07/19/19 0715 (!) 141/79     Pulse Rate 07/19/19 0715 77     Resp 07/19/19 0715 20     Temp 07/19/19 0719 98.3 F (36.8 C)     Temp Source 07/19/19 0719 Oral     SpO2 07/19/19 0715 97 %     Weight --      Height --      Head Circumference --      Peak Flow --      Pain Score 07/19/19 0719 3     Pain Loc --      Pain Edu? --  Excl. in [place]? --     Constitutional: Alert and oriented. Well appearing and in no distress. Head: Normocephalic and atraumatic. Eyes: Conjunctivae are normal. PERRL. Normal extraocular movements Neck: Supple. No thyromegaly. Cardiovascular: Normal rate, regular rhythm. Normal distal pulses. No murmurs, rubs, or gallops. Normal S1 & S2.  Respiratory: Normal respiratory effort. No wheezes/rales/rhonchi. Gastrointestinal: Soft and nontender. No distention. Musculoskeletal: Normal spinal alignment without midline tenderness, spasm, deformity, or step-off.  Patient with reproducible anterior lateral chest wall pain with  palpation just under the right pectoralis muscle.  Pain is also elicited with extension of the right upper extremity.  Nontender with normal range of motion in all extremities.  Neurologic:  Normal gait without ataxia. Normal speech and language. No gross focal neurologic deficits are appreciated. Skin:  Skin is warm, dry and intact. No rash noted.  No vesicular rash, erythema, or abrasions noted to the anterior chest wall Psychiatric: Mood and affect are normal. Patient exhibits appropriate insight and judgment. ____________________________________________   LABS (pertinent positives/negatives)  Labs Reviewed  BASIC METABOLIC PANEL - Abnormal; Notable for the following components:      Result Value   Glucose, Bld 133 (*)    All other components within normal limits  CBC  TROPONIN I (HIGH SENSITIVITY)  TROPONIN I (HIGH SENSITIVITY)  ____________________________________________  EKG See EKG report ____________________________________________   RADIOLOGY  CXR  Negative ____________________________________________  PROCEDURES  Oxycodone 5-325 mg PO Procedures ____________________________________________  INITIAL IMPRESSION / ASSESSMENT AND PLAN / ED COURSE  Differential diagnosis includes, but is not limited to, ACS, aortic dissection, pulmonary embolism, cardiac tamponade, pneumothorax, pneumonia, pericarditis, myocarditis, GI-related causes including esophagitis/gastritis, and musculoskeletal chest wall pain.    Patient presents to the ED for evaluation of reproducible anterolateral chest wall pain at the right pectoralis, that refers to the center of the chest.  Patient's exam is overall benign reassuring at this time.  Labs and EKG also without any signs of of acute ischemic injury or coronary syndrome.  Patient's heart score is 2 based on his presentation. He is reassured by his negative work-up. He will be discharged with prescriptions for cyclobenzaprine, prednisone, and  Lidoderm patches. He will follow-up with his PCP for ongoing symptoms, or return as needed.   DAESHAUN SPECHT was evaluated in Emergency Department on 07/19/2019 for the symptoms described in the history of present illness. He was evaluated in the context of the global COVID-19 pandemic, which necessitated consideration that the patient might be at risk for infection with the SARS-CoV-2 virus that causes COVID-19. Institutional protocols and algorithms that pertain to the evaluation of patients at risk for COVID-19 are in a state of rapid change based on information released by regulatory bodies including the CDC and federal and state organizations. These policies and algorithms were followed during the patient's care in the ED. ____________________________________________  FINAL CLINICAL IMPRESSION(S) / ED DIAGNOSES  Final diagnoses:  Nonspecific chest pain  Chest wall pain      Monish Haliburton, Dannielle Karvonen, PA-C 07/19/19 1823    Merlyn Lot, MD 07/20/19 [phone]

## 2019-07-19 NOTE — Discharge Instructions (Addendum)
Your exam, labs, and, CXR are all normal at this time. Your symptoms do not seem to indicate a heart (cardiac) cause, which is reassuring. Take the prescription meds as directed. Follow-up with your provider or return to the ED as needed.

## 2021-03-22 ENCOUNTER — Other Ambulatory Visit: Payer: Self-pay | Admitting: Obstetrics and Gynecology

## 2021-03-22 DIAGNOSIS — G35 Multiple sclerosis: Secondary | ICD-10-CM

## 2021-04-04 ENCOUNTER — Ambulatory Visit
Admission: RE | Admit: 2021-04-04 | Discharge: 2021-04-04 | Disposition: A | Discharge status: Home / Self Care | Payer: Disability Insurance | Attending: Obstetrics and Gynecology | Admitting: Obstetrics and Gynecology

## 2021-04-04 ENCOUNTER — Ambulatory Visit
Admission: RE | Admit: 2021-04-04 | Discharge: 2021-04-04 | Disposition: A | Discharge status: Home / Self Care | Payer: Disability Insurance | Source: Ambulatory Visit | Attending: Obstetrics and Gynecology | Admitting: Obstetrics and Gynecology

## 2021-04-04 DIAGNOSIS — G35 Multiple sclerosis: Secondary | ICD-10-CM | POA: Diagnosis not present

## 2021-10-19 IMAGING — CR DG CHEST 2V
2 series · 2 of 2 positions shown · non-contrast
Comparison: January 09, 2019.

CLINICAL DATA: Chest pain.

EXAM:
CHEST - 2 VIEW

[chest pa]
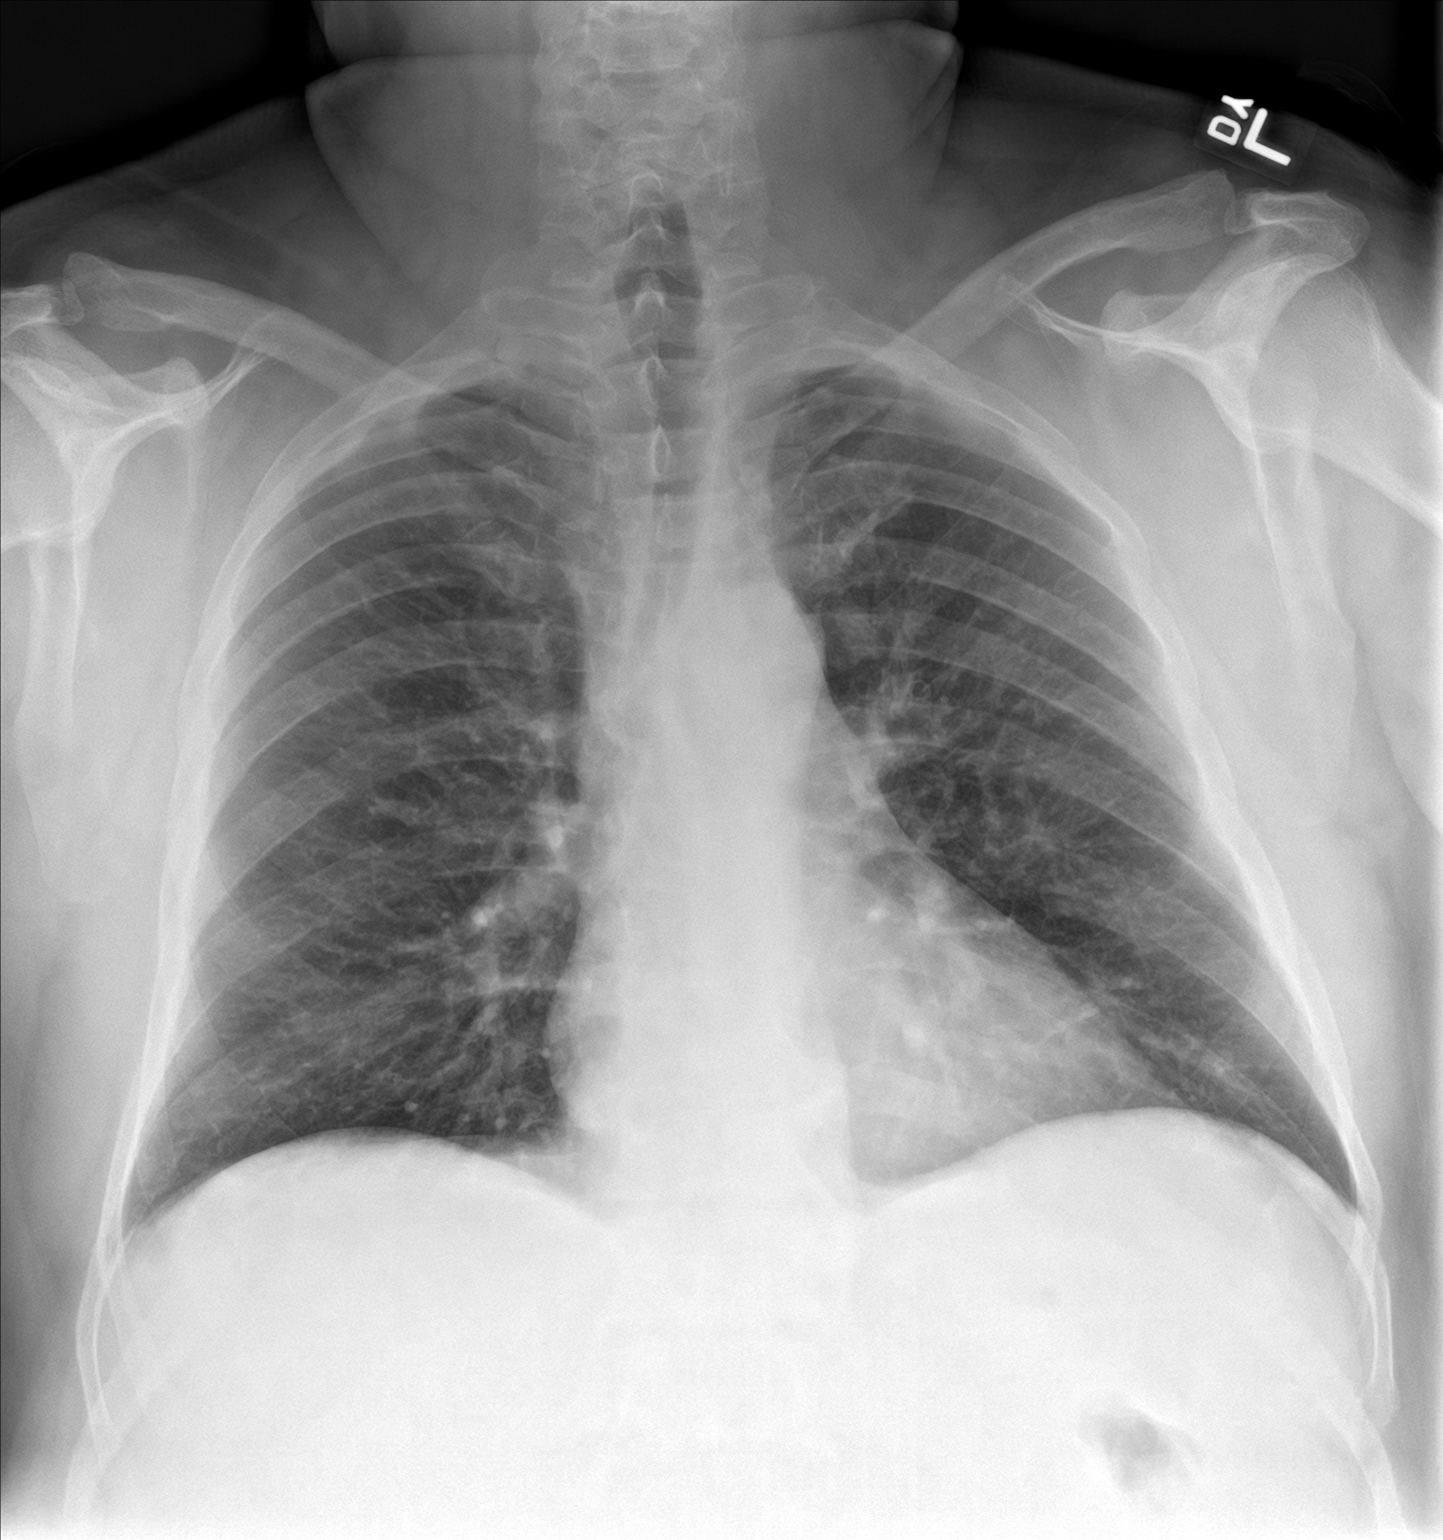

[chest lat]
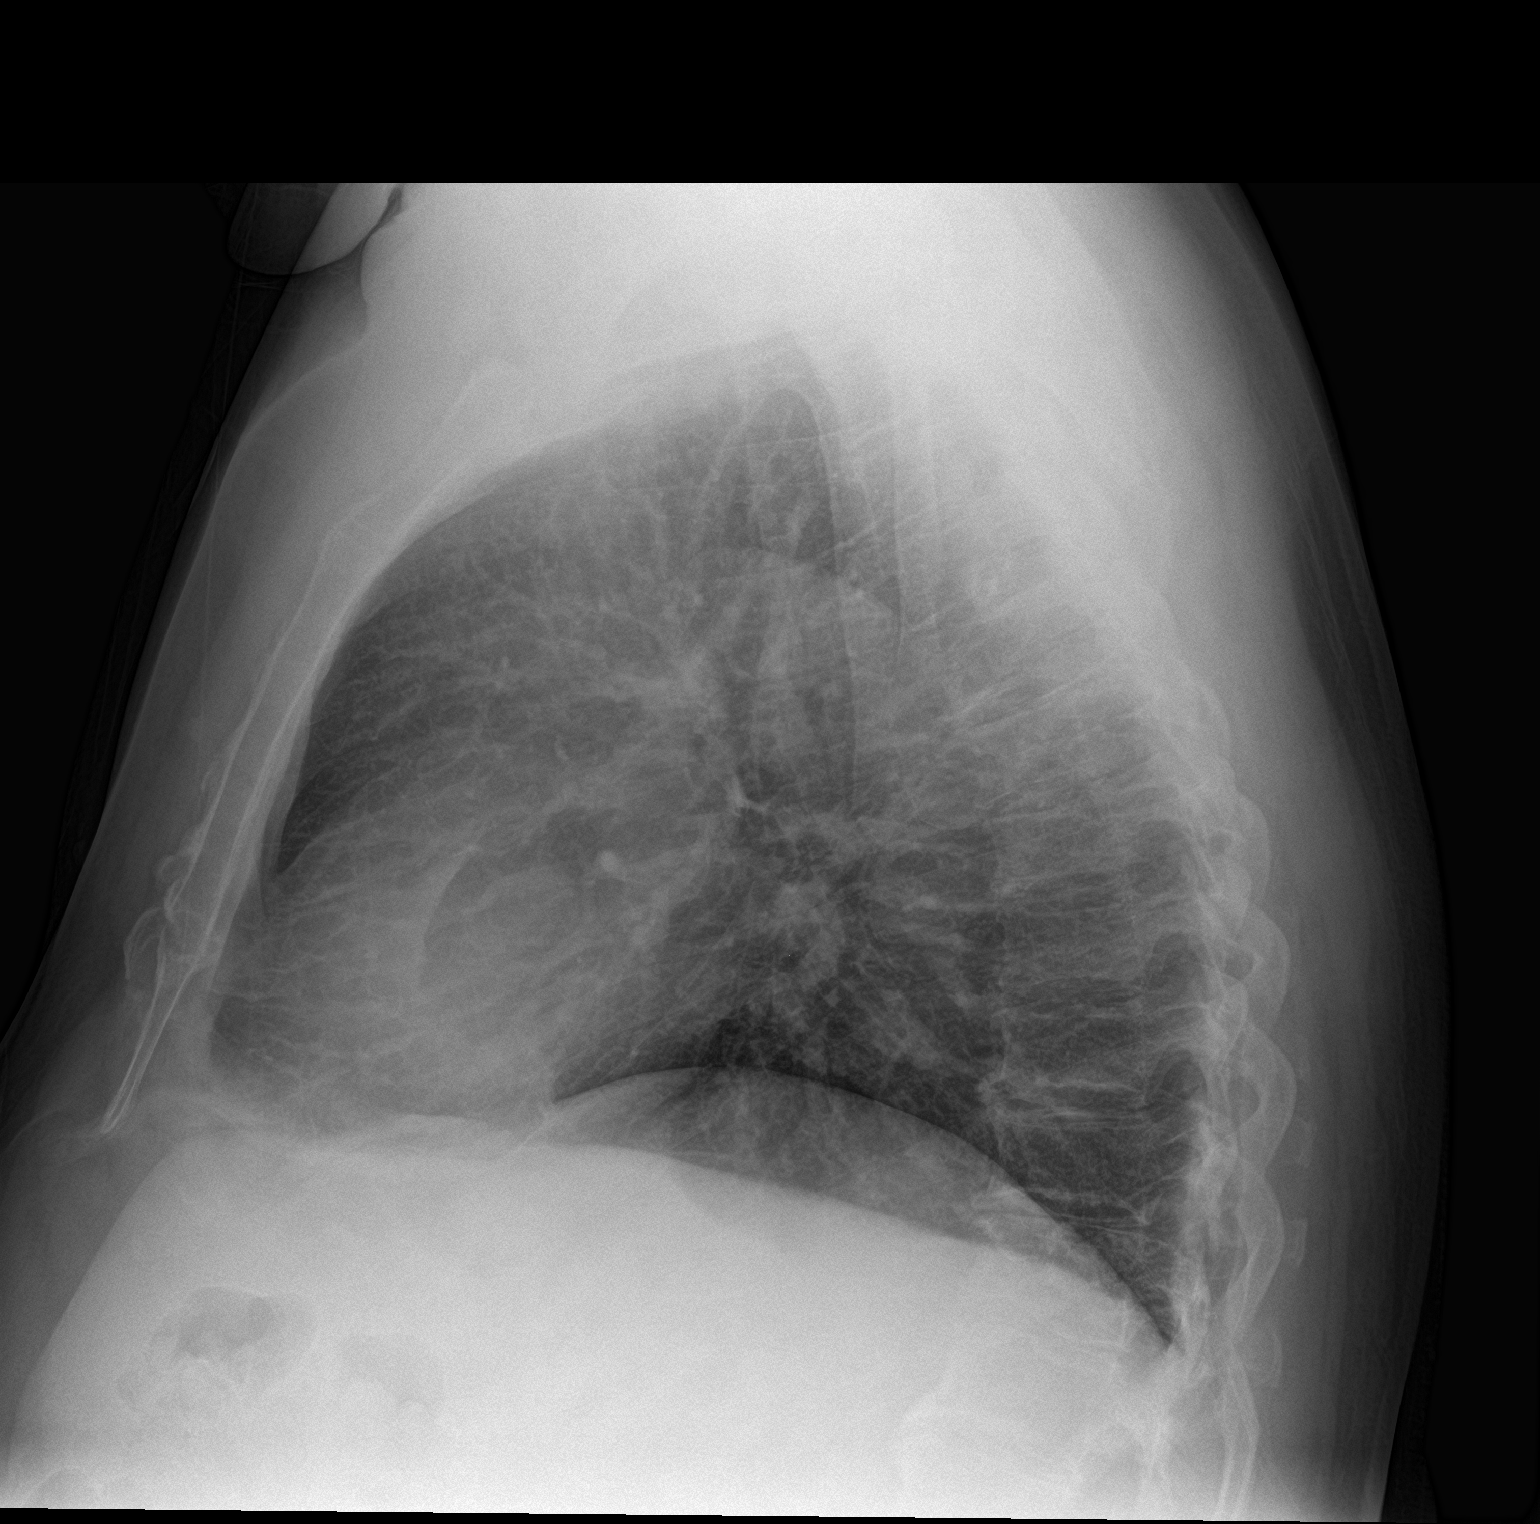

[2 of 2 positions shown; findings below may reference images not displayed]

FINDINGS: The heart size and mediastinal contours are within normal limits.
Both lungs are clear. No pneumothorax or pleural effusion is noted.
The visualized skeletal structures are unremarkable.
IMPRESSION: No active cardiopulmonary disease.

## 2022-03-13 ENCOUNTER — Other Ambulatory Visit: Payer: Self-pay | Admitting: Family Medicine

## 2022-03-13 DIAGNOSIS — R519 Headache, unspecified: Secondary | ICD-10-CM

## 2022-03-15 ENCOUNTER — Ambulatory Visit: Payer: 59

## 2022-05-12 ENCOUNTER — Other Ambulatory Visit: Payer: Self-pay | Admitting: Neurology

## 2022-05-12 DIAGNOSIS — G8192 Hemiplegia, unspecified affecting left dominant side: Secondary | ICD-10-CM

## 2022-05-29 ENCOUNTER — Ambulatory Visit: Payer: 59

## 2022-06-08 ENCOUNTER — Ambulatory Visit
Admission: RE | Admit: 2022-06-08 | Discharge: 2022-06-08 | Disposition: A | Discharge status: Home / Self Care | Payer: 59 | Source: Ambulatory Visit | Attending: Neurology | Admitting: Neurology

## 2022-06-08 DIAGNOSIS — G8192 Hemiplegia, unspecified affecting left dominant side: Secondary | ICD-10-CM

## 2022-06-08 MED ORDER — GADOBENATE DIMEGLUMINE 529 MG/ML IV SOLN
20.0000 mL | Freq: Once | INTRAVENOUS | Status: AC | PRN
  Administered 2022-06-08: 20 mL via INTRAVENOUS

## 2022-06-22 NOTE — Progress Notes (Deleted)
Referring Physician:  Center, [facility] [address] [place],  [place] [address]  Primary Physician:  Center, [facility]  History of Present Illness: 06/22/2022*** [name] is healthy.   Referral from neurology with left sided weakness and frequent falls. He had MRI of brain/cervical spine, labs, and EMG of bilateral legs was ordered.        Duration: *** Location: *** Quality: *** Severity: ***  Precipitating: aggravated by *** Modifying factors: made better by *** Weakness: none Timing: *** Bowel/Bladder Dysfunction: none  Conservative measures:  Physical therapy: ***  Multimodal medical therapy including regular antiinflammatories: ***  Injections: *** epidural steroid injections  Past Surgery: ***  David Kaiser has ***no symptoms of cervical myelopathy.  The symptoms are causing a significant impact on the patient's life.   Review of Systems:  A 10 point review of systems is negative, except for the pertinent positives and negatives detailed in the HPI.  Past Medical History: No past medical history on file.  Past Surgical History: Past Surgical History:  Procedure Laterality Date   FINGER SURGERY      Allergies: Allergies as of 07/04/2022 - Review Complete 07/19/2019  Allergen Reaction Noted   Bee venom  01/09/2019    Medications: Outpatient Encounter Medications as of 07/04/2022  Medication Sig   albuterol (PROVENTIL HFA;VENTOLIN HFA) 108 (90 Base) MCG/ACT inhaler Inhale 1-2 puffs into the lungs every 6 (six) hours as needed for wheezing or shortness of breath.   cyclobenzaprine (FLEXERIL) 5 MG tablet Take 1 tablet (5 mg total) by mouth 3 (three) times daily as needed.   No facility-administered encounter medications on file as of 07/04/2022.    Social History: Social History   Tobacco Use   Smoking status: Every Day    Packs/day: 0.50    Types: Cigarettes   Smokeless tobacco: Never   Substance Use Topics   Alcohol use: Yes    Alcohol/week: 3.0 - 12.0 standard drinks of alcohol    Types: 3 - 12 Cans of beer per week    Comment: daily   Drug use: Never    Family Medical History: No family history on file.  Physical Examination: There were no vitals filed for this visit.  General: Patient is well developed, well nourished, calm, collected, and in no apparent distress. Attention to examination is appropriate.  Respiratory: Patient is breathing without any difficulty.   NEUROLOGICAL:     Awake, alert, oriented to person, place, and time.  Speech is clear and fluent. Fund of knowledge is appropriate.   Cranial Nerves: Pupils equal round and reactive to light.  Facial tone is symmetric.  Facial sensation is symmetric.  ROM of spine:  *** ROM of cervical spine *** pain *** ROM of lumbar spine *** pain  No abnormal lesions on exposed skin.   Strength: Side Biceps Triceps Deltoid Interossei Grip Wrist Ext. Wrist Flex.  R 5 5 5 5 5 5 5   L 5 5 5 5 5 5 5    Side Iliopsoas Quads Hamstring PF DF EHL  R 5 5 5 5 5 5   L 5 5 5 5 5 5    Reflexes are ***2+ and symmetric at the biceps, triceps, brachioradialis, patella and achilles.   Hoffman's is absent.  Clonus is not present.   Bilateral upper and lower extremity sensation is intact to light touch.    No evidence of dysmetria noted.  Gait is normal.   ***No difficulty with tandem gait.  Medical Decision Making  Imaging: MRI of cervical spine dated 06/08/22:  FINDINGS: Mild intermittent motion degradation.   Alignment: No significant spondylolisthesis.   Vertebrae: Vertebral body height is maintained. Minimal degenerative endplate edema at [id number]. No significant marrow edema or focal suspicious osseous lesion elsewhere.   Cord: No signal abnormality identified within the cervical spinal cord.   Posterior Fossa, vertebral arteries, paraspinal tissues: Posterior fossa assessed on same-day brain MRI.  Flow voids preserved within the imaged cervical vertebral arteries. No paraspinal mass or collection.   Disc levels:   Multilevel disc degeneration. Most notably, disc degeneration is mild-to-moderate at C5-C6 and C6-C7.   C2-C3: No significant disc herniation or spinal canal stenosis. Uncovertebral hypertrophy on the right resulting in mild relative right neural foraminal narrowing.   C3-C4: Tiny central disc protrusion. Uncovertebral hypertrophy on the right. Minimal facet arthrosis. The disc protrusion mildly effaces the ventral thecal sac, and may contact the ventral aspect of the spinal cord. Mild right neural foraminal narrowing.   C4-C5: No significant disc herniation or spinal canal stenosis. Mild bilateral uncovertebral hypertrophy. Minimal facet arthrosis on the left. No significant spinal canal stenosis or neural foraminal narrowing.   C5-C6: Right center disc protrusion. Uncovertebral hypertrophy on the right. Mild facet arthrosis and ligamentum flavum thickening. The disc protrusion effaces the ventral thecal sac, contacting and mildly flattening the right ventral aspect of the spinal cord (series 10, image 21). However, the dorsal CSF space is maintained within the spinal canal. Moderate/severe right neural foraminal narrowing.   C6-C7: Disc bulge with right-sided disc osteophyte ridge/uncinate hypertrophy. Mild effacement of the ventral thecal sac (without spinal cord mass effect). Moderate right neural foraminal narrowing.   C7-T1: No significant disc herniation or stenosis.   IMPRESSION: Mildly motion degraded exam.   Cervical spondylosis, as outlined and with findings most notably as follows.   At C5-C6, there is mild-to-moderate disc degeneration. Right center disc protrusion. Right-sided uncovertebral hypertrophy. Mild facet arthrosis and ligamentum flavum thickening. The disc protrusion focally effaces the ventral thecal sac, contacting and  mildly flattening the right ventral aspect of the spinal cord. However, the dorsal CSF space is maintained within the spinal canal. Moderate/severe right neural foraminal narrowing.   No more than mild relative spinal canal narrowing at the remaining levels. Additional sites of foraminal stenosis, as detailed and greatest on the right at C6-C7 (moderate at this site).   Also of note, disc degeneration is mild to moderate at C6-C7 with minimal degenerative endplate edema at this level.     Electronically Signed   By: Kellie Simmering D.O.   On: 06/08/2022 18:41        I have personally reviewed the images and agree with the above interpretation.  Assessment and Plan: [name] is a pleasant 61 y.o. male with ***  Treatment options discussed with patient and following plan made:   - Order for physical therapy for *** spine ***. - Continue on current medications including ***. Reviewed proper dosing along with risks and benefits. Take and NSAIDs with food.      I spent a total of *** minutes in face-to-face and non-face-to-face activities related to this patient's care today including review of outside records, review of imaging, review of symptoms, physical exam, discussion of differential diagnosis, discussion of treatment options, and documentation.   Thank you for involving me in the care of this patient.   Geronimo Boot PA-C Dept. of Neurosurgery

## 2022-07-04 ENCOUNTER — Ambulatory Visit: Payer: 59 | Admitting: Orthopedic Surgery

## 2022-07-10 NOTE — Progress Notes (Deleted)
Referring Physician:  Center, [facility] [place],  [place] [address]  Primary Physician:  [facility], [place]  History of Present Illness: 07/10/2022*** [name] is healthy.   Referral from neurology with left sided weakness and frequent falls. He had MRI of brain/cervical spine, labs, and EMG of bilateral legs was ordered.        Duration: *** Location: *** Quality: *** Severity: ***  Precipitating: aggravated by *** Modifying factors: made better by *** Weakness: none Timing: *** Bowel/Bladder Dysfunction: none  Conservative measures:  Physical therapy: ***  Multimodal medical therapy including regular antiinflammatories: ***  Injections: *** epidural steroid injections  Past Surgery: ***  David Kaiser has ***no symptoms of cervical myelopathy.  The symptoms are causing a significant impact on the patient's life.   Review of Systems:  A 10 point review of systems is negative, except for the pertinent positives and negatives detailed in the HPI.  Past Medical History: No past medical history on file.  Past Surgical History: Past Surgical History:  Procedure Laterality Date   FINGER SURGERY      Allergies: Allergies as of 07/12/2022 - Review Complete 07/19/2019  Allergen Reaction Noted   Bee venom  01/09/2019    Medications: Outpatient Encounter Medications as of 07/12/2022  Medication Sig   albuterol (PROVENTIL HFA;VENTOLIN HFA) 108 (90 Base) MCG/ACT inhaler Inhale 1-2 puffs into the lungs every 6 (six) hours as needed for wheezing or shortness of breath.   cyclobenzaprine (FLEXERIL) 5 MG tablet Take 1 tablet (5 mg total) by mouth 3 (three) times daily as needed.   No facility-administered encounter medications on file as of 07/12/2022.    Social History: Social History   Tobacco Use   Smoking status: Every Day    Packs/day: 0.50    Types: Cigarettes   Smokeless tobacco: Never   Substance Use Topics   Alcohol use: Yes    Alcohol/week: 3.0 - 12.0 standard drinks of alcohol    Types: 3 - 12 Cans of beer per week    Comment: daily   Drug use: Never    Family Medical History: No family history on file.  Physical Examination: There were no vitals filed for this visit.  General: Patient is well developed, well nourished, calm, collected, and in no apparent distress. Attention to examination is appropriate.  Respiratory: Patient is breathing without any difficulty.   NEUROLOGICAL:     Awake, alert, oriented to person, place, and time.  Speech is clear and fluent. Fund of knowledge is appropriate.   Cranial Nerves: Pupils equal round and reactive to light.  Facial tone is symmetric.  Facial sensation is symmetric.  ROM of spine:  *** ROM of cervical spine *** pain *** ROM of lumbar spine *** pain  No abnormal lesions on exposed skin.   Strength: Side Biceps Triceps Deltoid Interossei Grip Wrist Ext. Wrist Flex.  R 5 5 5 5 5 5 5  $ L 5 5 5 5 5 5 5   $ Side Iliopsoas Quads Hamstring PF DF EHL  R 5 5 5 5 5 5  $ L 5 5 5 5 5 5   $ Reflexes are ***2+ and symmetric at the biceps, triceps, brachioradialis, patella and achilles.   Hoffman's is absent.  Clonus is not present.   Bilateral upper and lower extremity sensation is intact to light touch.    No evidence of dysmetria noted.  Gait is normal.   ***No difficulty with tandem gait.  Medical Decision Making  Imaging: MRI of cervical spine dated 06/08/22:  FINDINGS: Mild intermittent motion degradation.   Alignment: No significant spondylolisthesis.   Vertebrae: Vertebral body height is maintained. Minimal degenerative endplate edema at [id number]. No significant marrow edema or focal suspicious osseous lesion elsewhere.   Cord: No signal abnormality identified within the cervical spinal cord.   Posterior Fossa, vertebral arteries, paraspinal tissues: Posterior fossa assessed on same-day brain MRI.  Flow voids preserved within the imaged cervical vertebral arteries. No paraspinal mass or collection.   Disc levels:   Multilevel disc degeneration. Most notably, disc degeneration is mild-to-moderate at C5-C6 and C6-C7.   C2-C3: No significant disc herniation or spinal canal stenosis. Uncovertebral hypertrophy on the right resulting in mild relative right neural foraminal narrowing.   C3-C4: Tiny central disc protrusion. Uncovertebral hypertrophy on the right. Minimal facet arthrosis. The disc protrusion mildly effaces the ventral thecal sac, and may contact the ventral aspect of the spinal cord. Mild right neural foraminal narrowing.   C4-C5: No significant disc herniation or spinal canal stenosis. Mild bilateral uncovertebral hypertrophy. Minimal facet arthrosis on the left. No significant spinal canal stenosis or neural foraminal narrowing.   C5-C6: Right center disc protrusion. Uncovertebral hypertrophy on the right. Mild facet arthrosis and ligamentum flavum thickening. The disc protrusion effaces the ventral thecal sac, contacting and mildly flattening the right ventral aspect of the spinal cord (series 10, image 21). However, the dorsal CSF space is maintained within the spinal canal. Moderate/severe right neural foraminal narrowing.   C6-C7: Disc bulge with right-sided disc osteophyte ridge/uncinate hypertrophy. Mild effacement of the ventral thecal sac (without spinal cord mass effect). Moderate right neural foraminal narrowing.   C7-T1: No significant disc herniation or stenosis.   IMPRESSION: Mildly motion degraded exam.   Cervical spondylosis, as outlined and with findings most notably as follows.   At C5-C6, there is mild-to-moderate disc degeneration. Right center disc protrusion. Right-sided uncovertebral hypertrophy. Mild facet arthrosis and ligamentum flavum thickening. The disc protrusion focally effaces the ventral thecal sac, contacting and  mildly flattening the right ventral aspect of the spinal cord. However, the dorsal CSF space is maintained within the spinal canal. Moderate/severe right neural foraminal narrowing.   No more than mild relative spinal canal narrowing at the remaining levels. Additional sites of foraminal stenosis, as detailed and greatest on the right at C6-C7 (moderate at this site).   Also of note, disc degeneration is mild to moderate at C6-C7 with minimal degenerative endplate edema at this level.     Electronically Signed   By: Kellie Simmering D.O.   On: 06/08/2022 18:41        I have personally reviewed the images and agree with the above interpretation.  Assessment and Plan: [name] is a pleasant 62 y.o. male with ***  Treatment options discussed with patient and following plan made:   - Order for physical therapy for *** spine ***. - Continue on current medications including ***. Reviewed proper dosing along with risks and benefits. Take and NSAIDs with food.      I spent a total of *** minutes in face-to-face and non-face-to-face activities related to this patient's care today including review of outside records, review of imaging, review of symptoms, physical exam, discussion of differential diagnosis, discussion of treatment options, and documentation.   Thank you for involving me in the care of this patient.   Geronimo Boot PA-C Dept. of Neurosurgery

## 2022-07-12 ENCOUNTER — Ambulatory Visit: Payer: 59 | Admitting: Orthopedic Surgery

## 2022-12-05 ENCOUNTER — Other Ambulatory Visit: Payer: Self-pay | Admitting: Student

## 2022-12-05 DIAGNOSIS — G8192 Hemiplegia, unspecified affecting left dominant side: Secondary | ICD-10-CM

## 2023-07-06 IMAGING — CR DG HAND 2V*L*
1 series · 2 of 2 positions shown · non-contrast
Comparison: None.

CLINICAL DATA: Multiple sclerosis and bilateral hand pain.

EXAM:
RIGHT HAND - 2 VIEW; LEFT HAND - 2 VIEW

[Series 1: dg hand 2 view left · 0.14mm/px · 2 of 2 slices shown]
[im 1/2]
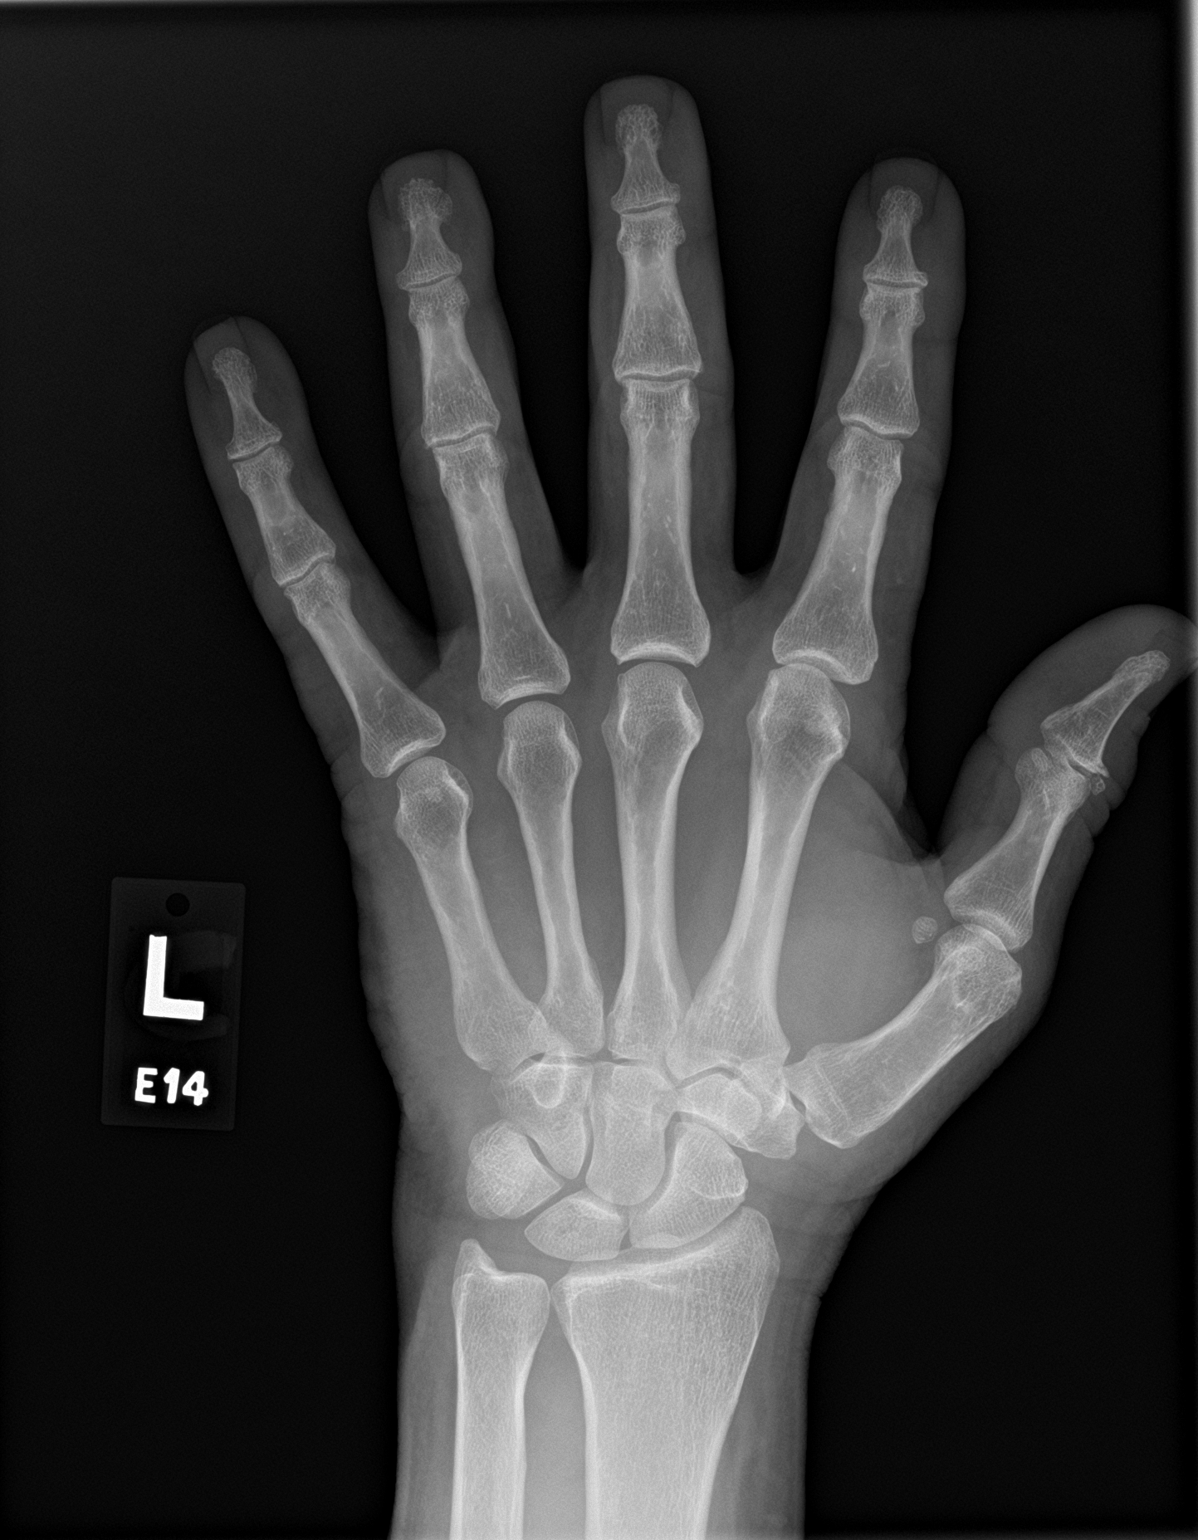
[im 2/2]
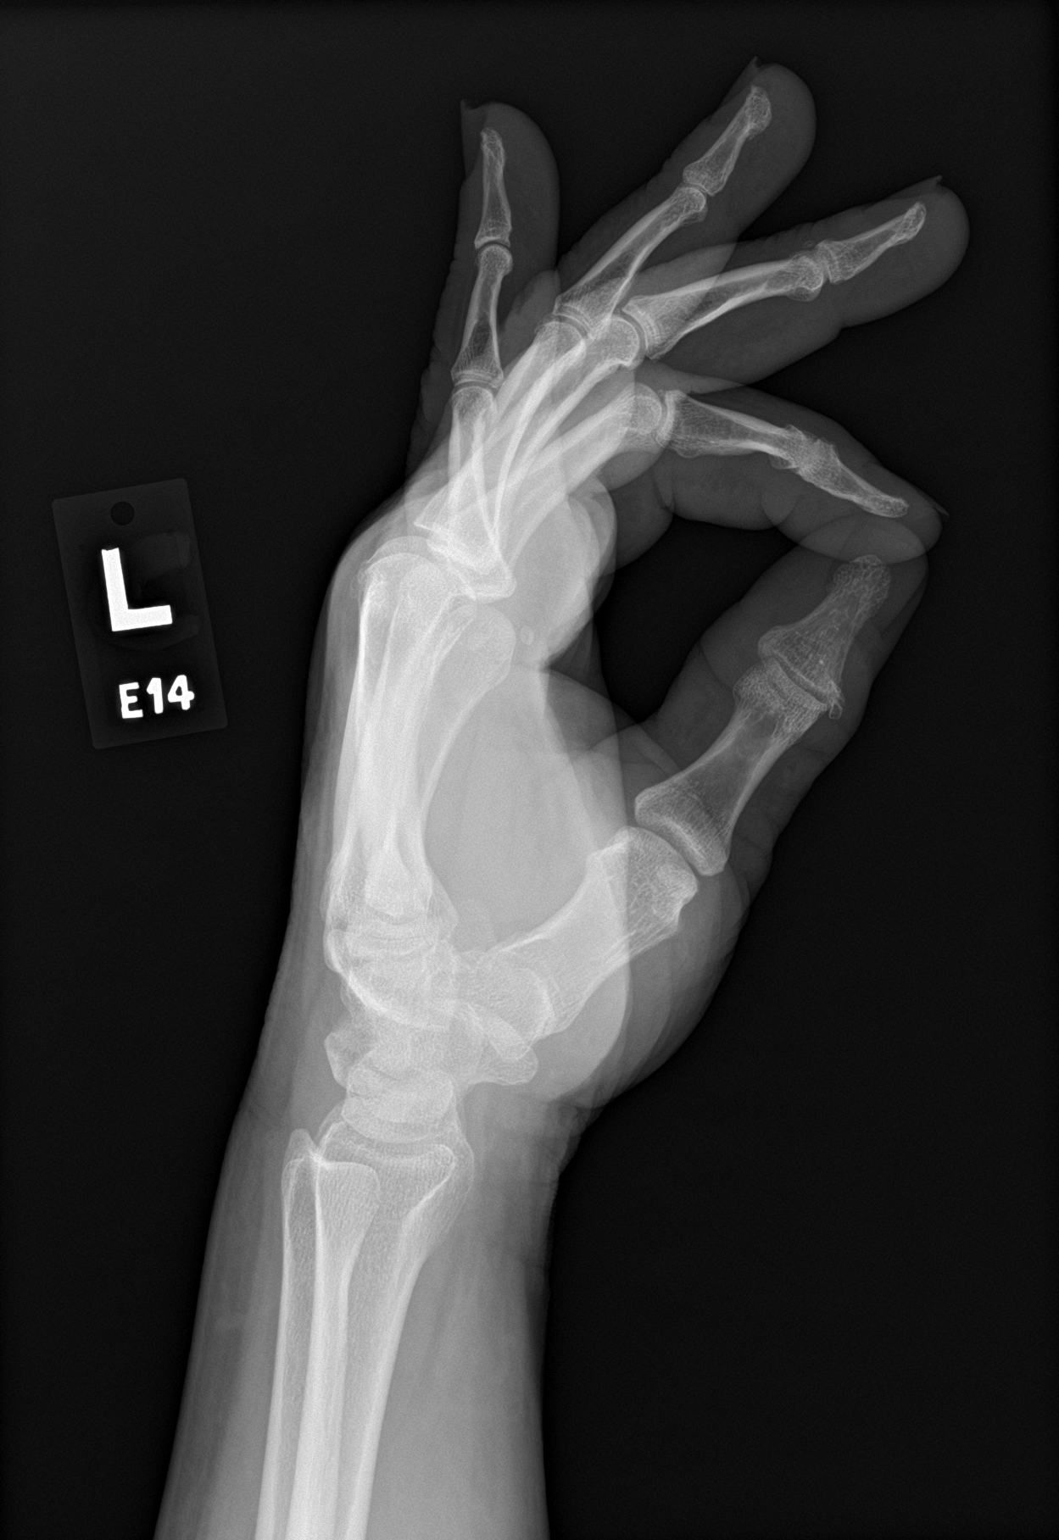

[2 of 2 positions shown; findings below may reference images not displayed]

FINDINGS: There is no acute fracture or dislocation. No suspicious bone
lesions. No significant arthritic changes. The soft tissues are
unremarkable.
IMPRESSION: Negative.
